# Patient Record
Sex: Male | Born: 1980 | Hispanic: Yes | Marital: Single
Health system: Southern US, Community
[De-identification: ages and names within clinical notes are randomized; demographics above are authoritative.]

---

## 2008-03-28 ENCOUNTER — Emergency Department: Payer: Self-pay | Admitting: Internal Medicine

## 2009-08-08 IMAGING — CR DG FOOT COMPLETE 3+V*L*
1 series · 3 of 3 positions shown · non-contrast
Comparison: none

REASON FOR EXAM: trauma to L foot
COMMENTS:

PROCEDURE:     DXR - DXR FOOT LT COMP W/OBLIQUES  - March 28, 2008  [DATE]
RESULT:     No fracture, dislocation or other acute bony abnormality is
identified.

[Series 1: view not recorded · 0.17mm/px · 3 of 3 slices shown]
[im 1/3]
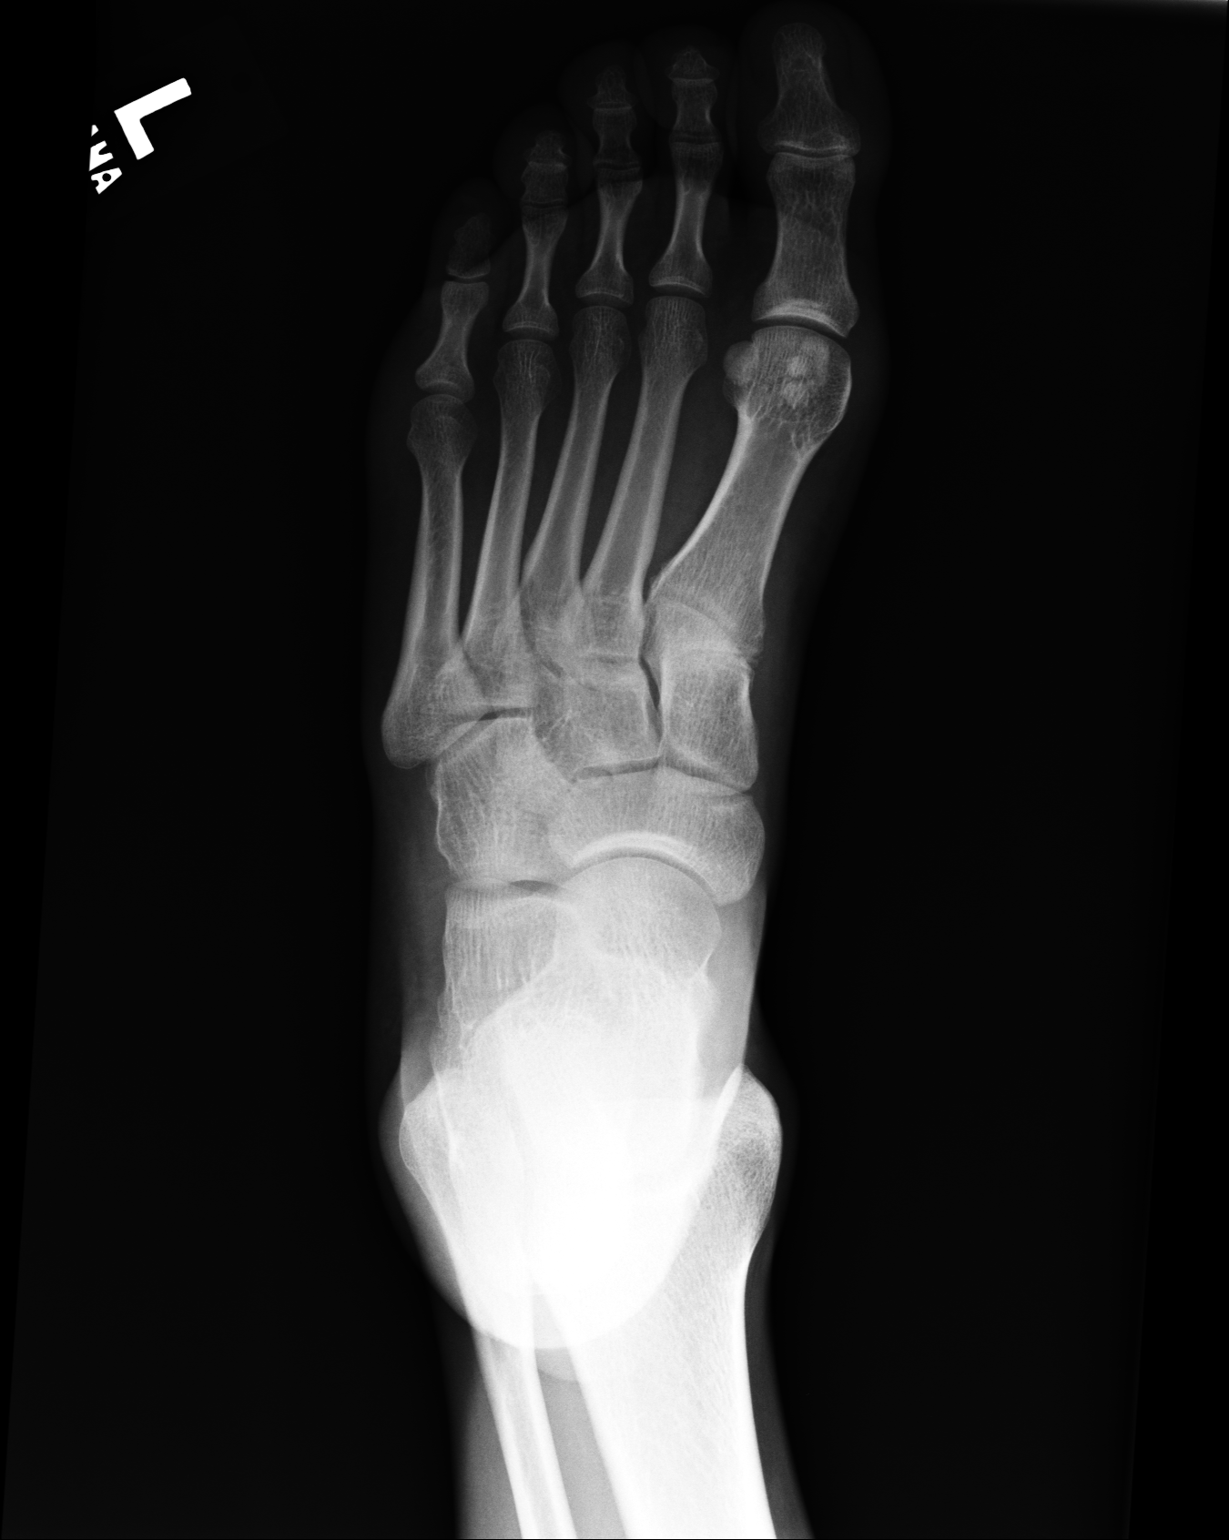
[im 2/3]
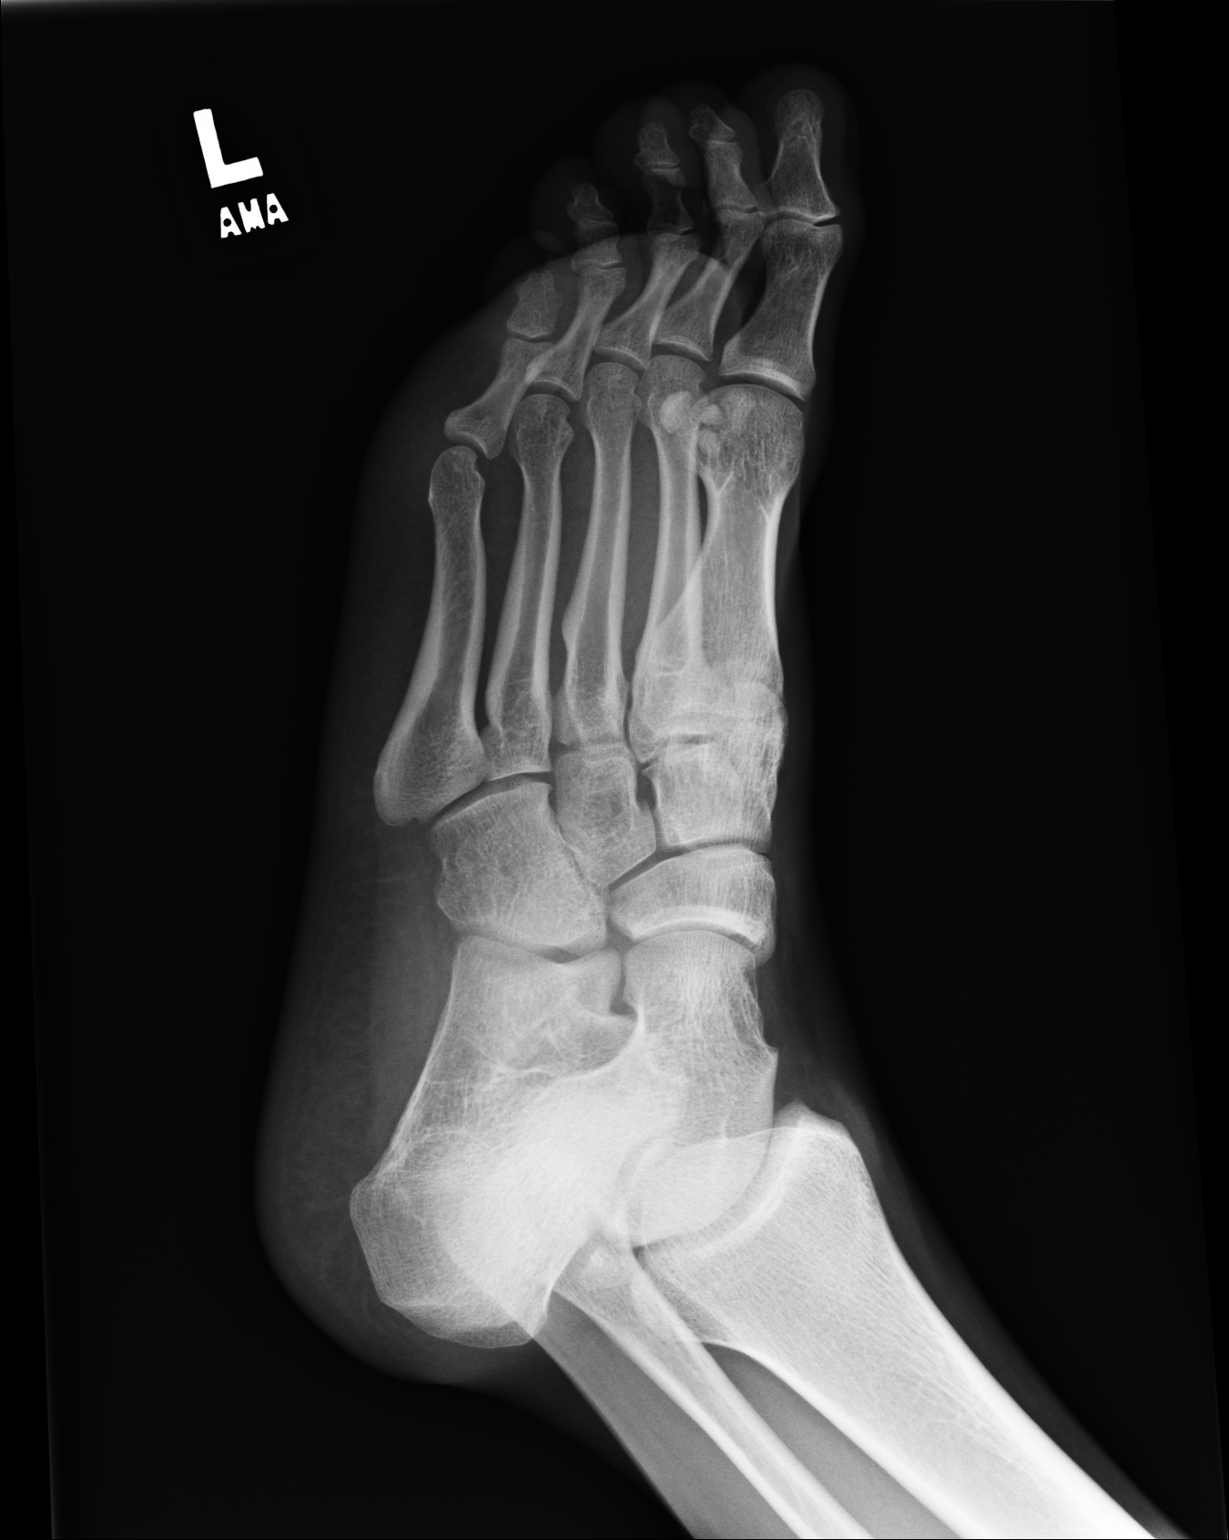
[im 3/3]
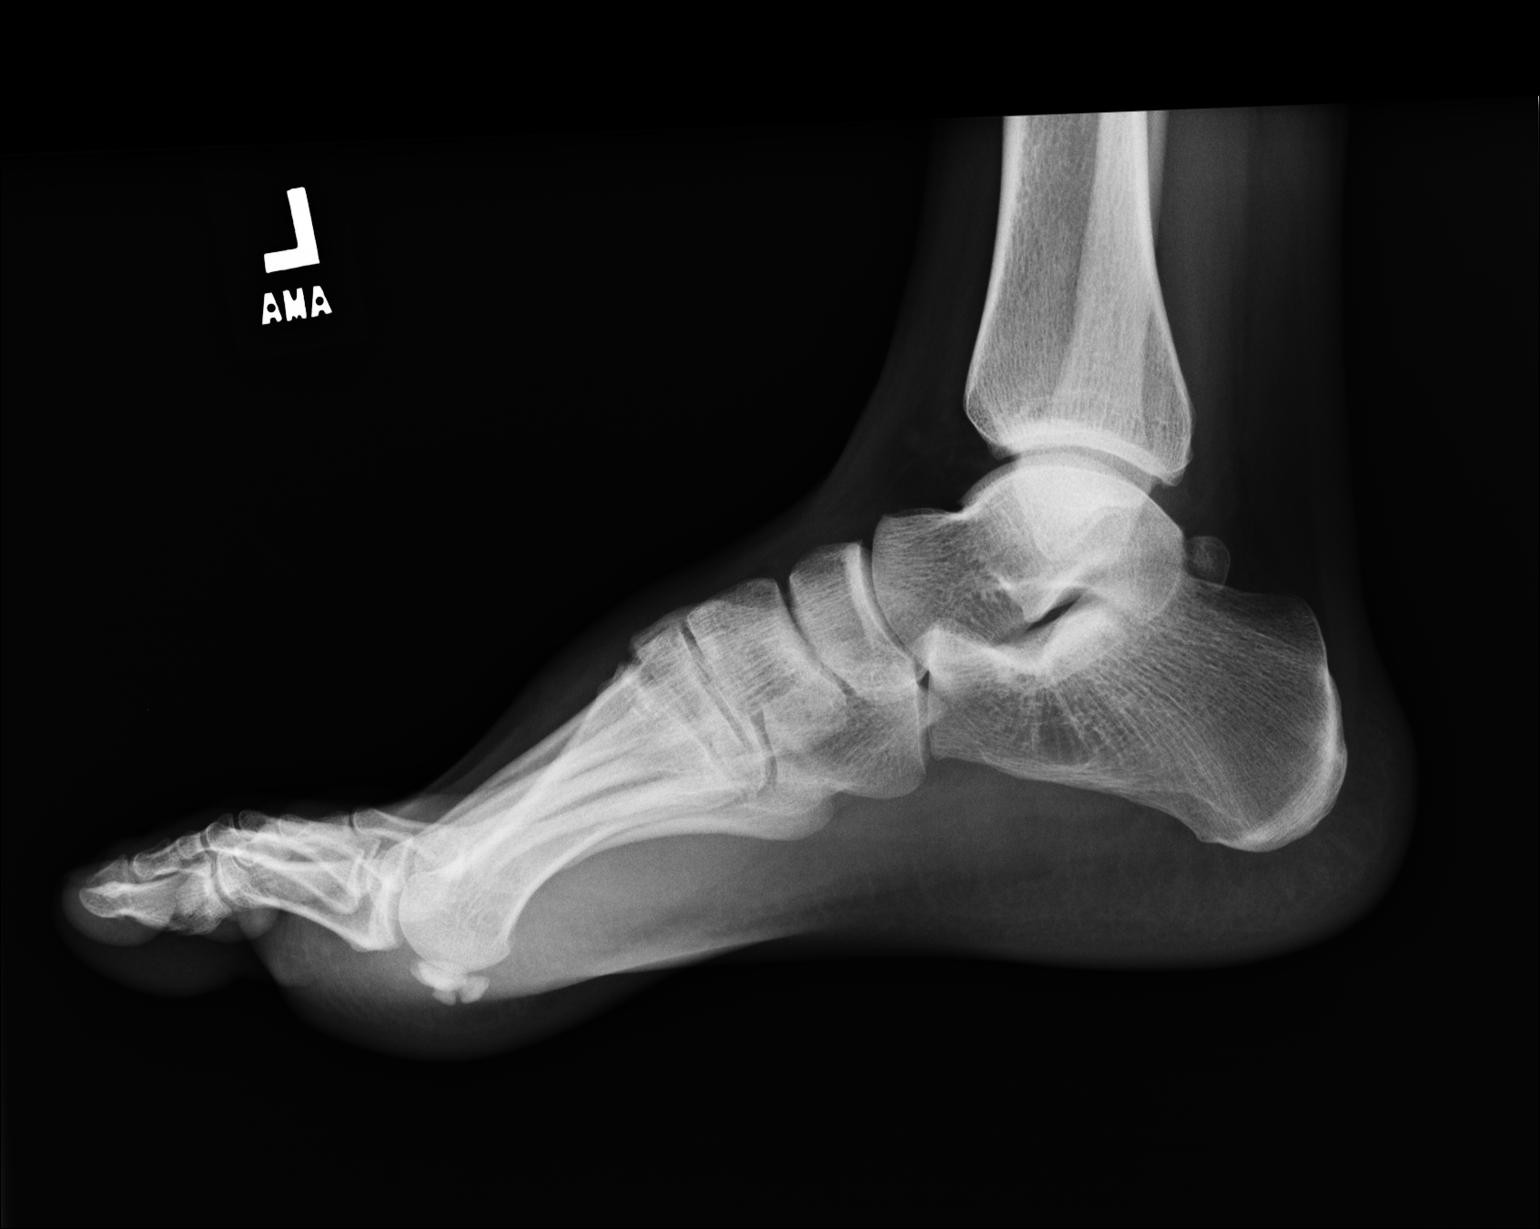

[3 of 3 positions shown; findings below may reference images not displayed]

IMPRESSION: 1.     No acute changes are identified.

## 2015-04-22 ENCOUNTER — Telehealth (HOSPITAL_COMMUNITY): Payer: Self-pay | Admitting: *Deleted

## 2015-04-24 ENCOUNTER — Inpatient Hospital Stay (HOSPITAL_COMMUNITY): Admission: RE | Admit: 2015-04-24 | Payer: Self-pay | Source: Ambulatory Visit

## 2018-04-29 NOTE — Telephone Encounter (Signed)
Preadmission screen
